# Patient Record
Sex: Male | Born: 2005 | Race: White | Hispanic: No | Marital: Single | Smoking: Never smoker
Health system: Southern US, Community
[De-identification: ages and names within clinical notes are randomized; demographics above are authoritative.]

---
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
## 2024-01-28 ENCOUNTER — Encounter: Payer: Self-pay | Admitting: Family Medicine
